# Patient Record
Sex: Male | Born: 1965 | Race: White | Hispanic: No | Smoking: Never smoker
Health system: Southern US, Community
[De-identification: ages and names within clinical notes are randomized; demographics above are authoritative.]

## PROBLEM LIST (undated history)

## (undated) DIAGNOSIS — G809 Cerebral palsy, unspecified: Secondary | ICD-10-CM

## (undated) DIAGNOSIS — E785 Hyperlipidemia, unspecified: Secondary | ICD-10-CM

## (undated) DIAGNOSIS — T7840XA Allergy, unspecified, initial encounter: Secondary | ICD-10-CM

## (undated) DIAGNOSIS — I10 Essential (primary) hypertension: Secondary | ICD-10-CM

## (undated) HISTORY — PX: EYE SURGERY: SHX253

## (undated) HISTORY — DX: Essential (primary) hypertension: I10

## (undated) HISTORY — PX: HERNIA REPAIR: SHX51

## (undated) HISTORY — DX: Hyperlipidemia, unspecified: E78.5

## (undated) HISTORY — DX: Allergy, unspecified, initial encounter: T78.40XA

## (undated) HISTORY — DX: Cerebral palsy, unspecified: G80.9

---

## 2001-06-19 ENCOUNTER — Encounter: Admission: RE | Admit: 2001-06-19 | Discharge: 2001-09-17 | Payer: Self-pay | Admitting: *Deleted

## 2001-08-13 ENCOUNTER — Encounter: Admission: RE | Admit: 2001-08-13 | Discharge: 2001-10-23 | Payer: Self-pay | Admitting: Neurology

## 2002-06-10 ENCOUNTER — Encounter: Admission: RE | Admit: 2002-06-10 | Discharge: 2002-07-11 | Payer: Self-pay | Admitting: *Deleted

## 2003-12-15 ENCOUNTER — Encounter: Admission: RE | Admit: 2003-12-15 | Discharge: 2003-12-15 | Payer: Self-pay | Admitting: Family Medicine

## 2008-06-24 ENCOUNTER — Encounter: Admission: RE | Admit: 2008-06-24 | Discharge: 2008-06-24 | Payer: Self-pay | Admitting: Family Medicine

## 2015-05-01 ENCOUNTER — Ambulatory Visit (INDEPENDENT_AMBULATORY_CARE_PROVIDER_SITE_OTHER): Payer: BLUE CROSS/BLUE SHIELD | Admitting: Family Medicine

## 2015-05-01 ENCOUNTER — Ambulatory Visit (INDEPENDENT_AMBULATORY_CARE_PROVIDER_SITE_OTHER): Payer: BLUE CROSS/BLUE SHIELD

## 2015-05-01 VITALS — BP 137/95 | HR 97 | Temp 98.9°F | Resp 18 | Ht 69.0 in | Wt 220.0 lb

## 2015-05-01 DIAGNOSIS — R05 Cough: Secondary | ICD-10-CM

## 2015-05-01 DIAGNOSIS — I10 Essential (primary) hypertension: Secondary | ICD-10-CM | POA: Diagnosis not present

## 2015-05-01 DIAGNOSIS — E785 Hyperlipidemia, unspecified: Secondary | ICD-10-CM | POA: Diagnosis not present

## 2015-05-01 DIAGNOSIS — G809 Cerebral palsy, unspecified: Secondary | ICD-10-CM

## 2015-05-01 DIAGNOSIS — R059 Cough, unspecified: Secondary | ICD-10-CM

## 2015-05-01 MED ORDER — PREDNISONE 20 MG PO TABS: ORAL_TABLET | ORAL | Status: AC

## 2015-05-01 MED ORDER — HYDROCODONE-HOMATROPINE 5-1.5 MG/5ML PO SYRP: 5.0000 mL | ORAL_SOLUTION | Freq: Three times a day (TID) | ORAL | Status: AC | PRN

## 2015-05-01 MED ORDER — AZITHROMYCIN 250 MG PO TABS: ORAL_TABLET | ORAL | Status: AC

## 2015-05-01 NOTE — Progress Notes (Addendum)
Patient ID: Veva Holes., male   DOB: 03/21/1966, 49 y.o.   MRN: [id number]   This chart was scribed for Elvina Sidle, MD by [facility], medical scribe at [facility]The patient was seen in exam room 08 and the patient's care was started at 2:54 PM.  Patient ID: Veva Holes. MRN: [id number], DOB: [birth year], 49 y.o. Date of Encounter: 05/01/2015  Primary Physician: No primary care provider on file.  Chief Complaint:  Chief Complaint  Patient presents with   chest congestion   Cough   see depression screening   HPI:  Jermika Olden. is a 49 y.o. male who presents to [facility] complaining of a cough for about two weeks. The cough does worsen at night. No fever. No history of any lung disease. He does not use an inhaler. He works at [employer], he does the audio for the news.  Past Medical History  Diagnosis Date   Allergy    Hypertension    Hyperlipidemia    Cerebral palsy     Home Meds: Prior to Admission medications   Medication Sig Start Date End Date Taking? Authorizing Provider  amLODipine (NORVASC) 10 MG tablet Take 10 mg by mouth daily.   Yes Historical Provider, MD  fenofibrate (TRICOR) 145 MG tablet Take 145 mg by mouth daily.   Yes Historical Provider, MD  rosuvastatin (CRESTOR) 5 MG tablet Take 5 mg by mouth daily.   Yes Historical Provider, MD   Allergies: Not on File  History   Social History   Marital Status: Single    Spouse Name: N/A   Number of Children: N/A   Years of Education: N/A   Occupational History   Not on file.   Social History Main Topics   Smoking status: Never Smoker    Smokeless tobacco: Not on file   Alcohol Use: 0.0 oz/week    0 Standard drinks or equivalent per week   Drug Use: No   Sexual Activity: Not on file   Other Topics Concern   Not on file   Social History Narrative   No narrative on file    Review of Systems: Constitutional:  negative for chills, fever, night sweats, weight changes, or fatigue  HEENT: negative for vision changes, hearing loss, congestion, rhinorrhea, ST, epistaxis, or sinus pressure Cardiovascular: negative for chest pain or palpitations Respiratory: negative for hemoptysis, wheezing, shortness of breath. Positive for cough. Abdominal: negative for abdominal pain, nausea, vomiting, diarrhea, or constipation Dermatological: negative for rash Neurologic: negative for headache, dizziness, or syncope All other systems reviewed and are otherwise negative with the exception to those above and in the HPI.  Physical Exam: Blood pressure 137/95, pulse 97, temperature 98.9 F (37.2 C), temperature source Oral, resp. rate 18, height  (1.753 m), weight 220 lb (99.791 kg), SpO2 95 %., Body mass index is 32.47 kg/(m^2). General: Well developed, well nourished, in no acute distress. Head: Normocephalic, atraumatic, eyes without discharge, sclera non-icteric, nares are without discharge. Bilateral auditory canals clear, TM's are without perforation, pearly grey and translucent with reflective cone of light bilaterally. Oral cavity moist, posterior pharynx without exudate, erythema, peritonsillar abscess, or post nasal drip.  Neck: Supple. No thyromegaly. Full ROM. No lymphadenopathy. Lungs:  A few rales in the right base, expiratory wheezes that is intermittent at both sides. Heart: RRR with S1 S2. No murmurs, rubs, or gallops appreciated. Abdomen: Soft, non-tender, non-distended with normoactive  bowel sounds. No hepatomegaly. No rebound/guarding. No obvious abdominal masses. Msk:  Strength and tone normal for age. Extremities/Skin: Warm and dry. No clubbing or cyanosis. No edema. No rashes or suspicious lesions. Neuro: Alert and oriented X 3. Moves all extremities spontaneously. Gait is normal. CNII-XII grossly in tact. Psych:  Responds to questions appropriately with a normal affect.   [facility] reading  (PRIMARY) by  Dr. Milus Glazier. Negative CXR    ASSESSMENT AND PLAN:  49 y.o. year old male with   This chart was scribed in my presence and reviewed by me personally.    ICD-9-CM ICD-10-CM   1. Cough 786.2 R05 DG Chest 2 View     HYDROcodone-homatropine (HYCODAN) 5-1.5 MG/5ML syrup     azithromycin (ZITHROMAX) 250 MG tablet     predniSONE (DELTASONE) 20 MG tablet  2. Cerebral palsy 343.9 G80.9   3. Essential hypertension 401.9 I10   4. Hyperlipidemia 272.4 E78.5      Signed, Elvina Sidle, MD 05/01/2015 3:34 PM

## 2015-05-01 NOTE — Patient Instructions (Signed)

## 2019-10-30 ENCOUNTER — Ambulatory Visit: Payer: Commercial Managed Care - PPO | Attending: Internal Medicine

## 2019-10-30 DIAGNOSIS — Z20822 Contact with and (suspected) exposure to covid-19: Secondary | ICD-10-CM

## 2019-10-30 DIAGNOSIS — Z20828 Contact with and (suspected) exposure to other viral communicable diseases: Secondary | ICD-10-CM | POA: Insufficient documentation

## 2019-10-31 LAB — NOVEL CORONAVIRUS, NAA: SARS-CoV-2, NAA: NOT DETECTED

## 2019-11-20 ENCOUNTER — Ambulatory Visit: Payer: Commercial Managed Care - PPO
